# Patient Record
Sex: Female | Born: 1970 | Hispanic: No | Marital: Married | Smoking: Never smoker
Health system: Southern US, Community
[De-identification: ages and names within clinical notes are randomized; demographics above are authoritative.]

## PROBLEM LIST (undated history)

## (undated) DIAGNOSIS — J45909 Unspecified asthma, uncomplicated: Secondary | ICD-10-CM

---

## 2000-04-03 ENCOUNTER — Emergency Department (HOSPITAL_COMMUNITY): Admission: EM | Admit: 2000-04-03 | Discharge: 2000-04-03 | Payer: Self-pay | Admitting: Emergency Medicine

## 2012-01-30 ENCOUNTER — Other Ambulatory Visit: Payer: Self-pay | Admitting: Radiology

## 2012-01-30 DIAGNOSIS — R897 Abnormal histological findings in specimens from other organs, systems and tissues: Secondary | ICD-10-CM

## 2012-02-02 ENCOUNTER — Inpatient Hospital Stay
Admission: RE | Admit: 2012-02-02 | Discharge: 2012-02-02 | Payer: Self-pay | Source: Ambulatory Visit | Attending: Radiology | Admitting: Radiology

## 2012-03-26 ENCOUNTER — Ambulatory Visit
Admission: RE | Admit: 2012-03-26 | Discharge: 2012-03-26 | Disposition: A | Discharge status: Home / Self Care | Payer: Medicaid Other | Source: Ambulatory Visit | Attending: Radiology | Admitting: Radiology

## 2012-03-26 DIAGNOSIS — R897 Abnormal histological findings in specimens from other organs, systems and tissues: Secondary | ICD-10-CM

## 2012-03-26 MED ORDER — GADOBENATE DIMEGLUMINE 529 MG/ML IV SOLN
19.0000 mL | Freq: Once | INTRAVENOUS | Status: AC | PRN
  Administered 2012-03-26: 19 mL via INTRAVENOUS

## 2013-04-10 ENCOUNTER — Encounter (HOSPITAL_COMMUNITY): Payer: Self-pay | Admitting: Physical Medicine and Rehabilitation

## 2013-04-10 ENCOUNTER — Emergency Department (HOSPITAL_COMMUNITY)
Admission: EM | Admit: 2013-04-10 | Discharge: 2013-04-10 | Disposition: A | Discharge status: Home / Self Care | Payer: Medicaid Other | Attending: Emergency Medicine | Admitting: Emergency Medicine

## 2013-04-10 ENCOUNTER — Emergency Department (HOSPITAL_COMMUNITY): Payer: Medicaid Other

## 2013-04-10 DIAGNOSIS — R0602 Shortness of breath: Secondary | ICD-10-CM

## 2013-04-10 DIAGNOSIS — J3489 Other specified disorders of nose and nasal sinuses: Secondary | ICD-10-CM | POA: Insufficient documentation

## 2013-04-10 DIAGNOSIS — M7989 Other specified soft tissue disorders: Secondary | ICD-10-CM | POA: Insufficient documentation

## 2013-04-10 DIAGNOSIS — R079 Chest pain, unspecified: Secondary | ICD-10-CM

## 2013-04-10 DIAGNOSIS — R05 Cough: Secondary | ICD-10-CM

## 2013-04-10 DIAGNOSIS — R0789 Other chest pain: Secondary | ICD-10-CM

## 2013-04-10 DIAGNOSIS — R071 Chest pain on breathing: Secondary | ICD-10-CM | POA: Insufficient documentation

## 2013-04-10 DIAGNOSIS — J45901 Unspecified asthma with (acute) exacerbation: Secondary | ICD-10-CM | POA: Insufficient documentation

## 2013-04-10 HISTORY — DX: Unspecified asthma, uncomplicated: J45.909

## 2013-04-10 LAB — CBC WITH DIFFERENTIAL/PLATELET
Basophils Absolute: 0 10*3/uL (ref 0.0–0.1)
Basophils Relative: 1 % (ref 0–1)
Eosinophils Relative: 2 % (ref 0–5)
Lymphocytes Relative: 25 % (ref 12–46)
MCHC: 33.9 g/dL (ref 30.0–36.0)
MCV: 85.8 fL (ref 78.0–100.0)
Platelets: 316 10*3/uL (ref 150–400)
RDW: 15 % (ref 11.5–15.5)
WBC: 8.7 10*3/uL (ref 4.0–10.5)

## 2013-04-10 LAB — BASIC METABOLIC PANEL
CO2: 22 mEq/L (ref 19–32)
Calcium: 9.2 mg/dL (ref 8.4–10.5)
Creatinine, Ser: 0.58 mg/dL (ref 0.50–1.10)
GFR calc Af Amer: >90 mL/min (ref >90)
GFR calc non Af Amer: >90 mL/min (ref >90)
Sodium: 136 mEq/L (ref 135–145)

## 2013-04-10 LAB — POCT I-STAT TROPONIN I: Troponin i, poc: 0.03 ng/mL (ref 0.00–0.08)

## 2013-04-10 MED ORDER — ALBUTEROL SULFATE HFA 108 (90 BASE) MCG/ACT IN AERS
4.0000 | INHALATION_SPRAY | Freq: Once | RESPIRATORY_TRACT | Status: AC
  Administered 2013-04-10: 4 via RESPIRATORY_TRACT
  Filled 2013-04-10: qty 6.7

## 2013-04-10 NOTE — ED Notes (Signed)
Pt to xray

## 2013-04-10 NOTE — ED Provider Notes (Signed)
PT with sharp, left sided chest pain, +bronchitis symptoms.  No SOB, no pleuritic pain, no calf tenderness.  Mild edema to both legs, L>R.  No pneumonia, no risk factors for VTE.  No symptoms suggestive of ACS.  Nancy Bucco, MD 04/10/13 [phone]

## 2013-04-10 NOTE — ED Provider Notes (Signed)
CSN: [id number]     Arrival date & time 04/10/13  1309 History   None    Chief Complaint  Patient presents with  . Chest Pain   (Consider location/radiation/quality/duration/timing/severity/associated sxs/prior Treatment) Patient is a 42 y.o. female presenting with chest pain. The history is provided by the patient. No language interpreter was used.  Chest Pain Pain location:  L chest Pain quality: aching and sharp   Pain radiates to:  Does not radiate Pain radiates to the back: no   Pain severity:  Mild Onset quality:  Gradual Duration:  8 hours Timing:  Constant Progression:  Worsening Chronicity:  New Context: at rest   Relieved by:  Nothing Worsened by:  Nothing tried Ineffective treatments: inhaler, NSAIDS. Associated symptoms: cough   Associated symptoms: no abdominal pain, no dysphagia, no fever, no headache, no nausea, no orthopnea, no palpitations, no shortness of breath, no syncope and not vomiting     Past Medical History  Diagnosis Date  . Asthma    No past surgical history on file. No family history on file. History  Substance Use Topics  . Smoking status: Never Smoker   . Smokeless tobacco: Not on file  . Alcohol Use: No   OB History   Grav Para Term Preterm Abortions TAB SAB Ect Mult Living                 Review of Systems  Constitutional: Negative for fever.  HENT: Positive for congestion. Negative for sore throat, rhinorrhea and trouble swallowing.   Respiratory: Positive for cough. Negative for shortness of breath.   Cardiovascular: Positive for chest pain and leg swelling. Negative for palpitations, orthopnea and syncope.  Gastrointestinal: Negative for nausea, vomiting, abdominal pain and diarrhea.  Genitourinary: Negative for dysuria and hematuria.  Neurological: Negative for syncope, light-headedness and headaches.  All other systems reviewed and are negative.    Allergies  Review of patient's allergies indicates no known  allergies.  Home Medications  No current outpatient prescriptions on file. BP 131/82  Pulse 87  Temp(Src) 98.2 F (36.8 C) (Oral)  Resp 16  SpO2 96% Physical Exam  Nursing note and vitals reviewed. Constitutional: She is oriented to person, place, and time. She appears well-developed and well-nourished. No distress.  HENT:  Head: Normocephalic and atraumatic.  Eyes: EOM are normal. Pupils are equal, round, and reactive to light.  Neck: Normal range of motion. Neck supple.  Cardiovascular: Normal rate, regular rhythm, normal heart sounds and intact distal pulses.  Exam reveals no gallop and no friction rub.   No murmur heard. Pulmonary/Chest: Effort normal. No respiratory distress. She has no wheezes. She has rales (course rales in the bases bilaterally). She exhibits tenderness.  Abdominal: Soft. Bowel sounds are normal. She exhibits no distension. There is no tenderness. There is no rebound.  Musculoskeletal: Normal range of motion. She exhibits no edema and no tenderness.  Unilateral left leg swelling> right  Lymphadenopathy:    She has no cervical adenopathy.  Neurological: She is alert and oriented to person, place, and time.  Skin: Skin is warm. No rash noted. She is not diaphoretic.  Psychiatric: She has a normal mood and affect. Her behavior is normal.    ED Course  Procedures (including critical care time) Labs Review Labs Reviewed  BASIC METABOLIC PANEL - Abnormal; Notable for the following:    Glucose, Bld 100 (*)    All other components within normal limits  CBC WITH DIFFERENTIAL  D-DIMER, QUANTITATIVE  POCT  I-STAT TROPONIN I   Imaging Review Dg Chest 2 View  04/10/2013   *RADIOLOGY REPORT*  Clinical Data: Chest pain  CHEST - 2 VIEW  Comparison: None.  Findings: Heart and pulmonary vascularity are within normal limits. Lungs are clear bilaterally.  No acute bony abnormality is noted.  IMPRESSION: No acute abnormalities seen.   Original Report Authenticated By:  Alcide Clever, M.D.    MDM   1. Chest pain   2. Shortness of breath   3. Cough   4. Chest wall pain    2:42 PM Pt is a 42 y.o. female with pertinent PMHX of asthma,bronchitis who presents to the ED with chest pain. Chest pain for . Described as sharp substernal/left side of chest/right side of chest/entire chest with/without radiation to left arm/left jaw. No  Associated shortness of breath, diaphoresis, nausea and vomiting. Deniespleuritic chest pain. Denies, endorses recent illness, cough, congestion, URI. Daughter sick with URI 3 weeks ago.  Denies personal history/family history of DVT/PE, denies recent immobilization, denies OCP use. Symptoms worse with nothing, relieved with none. Unilateral leg swelling of the left leg.  Risk Factors: no premature CAD, no smoking, PERC negative  Previous Stress test none. Previous Cath: none.  Primary Cardiologist: none.  On exam: AFVSS. Course rales in bases, that cleared somewhat with cough. Chest tenderness. Leftl eg swelling>right. Plan for CBC, BMP, iStat troponin, CXR and EKG. Will also give 4 puffs albuterol and d-dimer.  EKG personally reviewed by myself showed NSR Rate of 93, PR , QRS 64ms QT/QTC 352/424ms, normal axis, without evidence of new ischemia. No Comparison , indication: chest pain no evidence of acute pericarditis  CXR PA/LAT per my read for chest pain showed no ptx, no pneumonia, no cardiomegaly.  Review of labs showed iStat troponin 0.03, CBC showed no leukocytosis, no evidence of anemia. BMP showed no electrolyte abnormalities. D-dimer negative, low clinical suspicion for PE or DVT. Plan for discharge. On re-evaluation pt feels much better after albuterol. Will have pt continue albuterol as needed. Likely chest wall pain.  5:13 PM:  I have discussed the diagnosis/risks/treatment options with the patient and believe the pt to be eligible for discharge home to follow-up with PCP in 1 week. We also discussed returning to the ED  immediately if new or worsening sx occur. We discussed the sx which are most concerning (e.g., worsening chest pain, shortness of breath, fevers) that necessitate immediate return. Any new prescriptions provided to the patient are listed below.   New Prescriptions   No medications on file    The patient appears reasonably screened and/or stabilized for discharge and I doubt any other medical condition or other [facility] requiring further screening, evaluation or treatment in the ED at this time prior to discharge . Pt in agreement with discharge plan. Return precautions given. Pt discharged VSS  Labs, EKG and imaging reviewed by myself and considered in medical decision making if ordered.  Imaging interpreted by radiology. Pt was discussed with my attending, Dr. Nicole Cella, MD 04/11/13 [phone]

## 2013-04-10 NOTE — ED Notes (Signed)
Pt presents to department for evaluation of L sided chest pain. Onset Saturday night. 7/10 pain at the time. States recent bronchitis. Respirations unlabored. Skin warm and dry. NAD.

## 2013-04-12 NOTE — ED Provider Notes (Signed)
I saw and evaluated the patient, reviewed the resident's note and I agree with the findings and plan. Pt with atypical CP, no ischemic changes on EKG, trop/d-dimer neg.  Will d/c with outpt f/u  Rolan Bucco, MD 04/12/13 1425

## 2013-09-21 ENCOUNTER — Emergency Department (HOSPITAL_COMMUNITY): Payer: Medicaid Other

## 2013-09-21 ENCOUNTER — Emergency Department (HOSPITAL_COMMUNITY)
Admission: EM | Admit: 2013-09-21 | Discharge: 2013-09-21 | Disposition: A | Discharge status: Home / Self Care | Payer: Medicaid Other | Attending: Emergency Medicine | Admitting: Emergency Medicine

## 2013-09-21 ENCOUNTER — Encounter (HOSPITAL_COMMUNITY): Payer: Self-pay | Admitting: Emergency Medicine

## 2013-09-21 DIAGNOSIS — B9789 Other viral agents as the cause of diseases classified elsewhere: Secondary | ICD-10-CM

## 2013-09-21 DIAGNOSIS — J45901 Unspecified asthma with (acute) exacerbation: Secondary | ICD-10-CM | POA: Insufficient documentation

## 2013-09-21 DIAGNOSIS — J069 Acute upper respiratory infection, unspecified: Secondary | ICD-10-CM | POA: Insufficient documentation

## 2013-09-21 MED ORDER — PREDNISONE 20 MG PO TABS: 40.0000 mg | ORAL_TABLET | Freq: Every day | ORAL | Status: AC

## 2013-09-21 MED ORDER — ALBUTEROL SULFATE (2.5 MG/3ML) 0.083% IN NEBU
5.0000 mg | INHALATION_SOLUTION | Freq: Once | RESPIRATORY_TRACT | Status: AC
  Administered 2013-09-21: 5 mg via RESPIRATORY_TRACT
  Filled 2013-09-21: qty 6

## 2013-09-21 NOTE — Discharge Instructions (Signed)
1. Medications: prednisone, usual home medications including her albuterol inhaler 2. Treatment: rest, drink plenty of fluids, Tylenol or ibuprofen for fever and chills 3. Follow Up: Please followup with your primary doctor for discussion of your diagnoses and further evaluation after today's visit;   Upper Respiratory Infection, Adult An upper respiratory infection (URI) is also known as the common cold. It is often caused by a type of germ (virus). Colds are easily spread (contagious). You can pass it to others by kissing, coughing, sneezing, or drinking out of the same glass. Usually, you get better in 1 or 2 weeks.  HOME CARE   Only take medicine as told by your doctor.  Use a warm mist humidifier or breathe in steam from a hot shower.  Drink enough water and fluids to keep your pee (urine) clear or pale yellow.  Get plenty of rest.  Return to work when your temperature is back to normal or as told by your doctor. You may use a face mask and wash your hands to stop your cold from spreading. GET HELP RIGHT AWAY IF:   After the first few days, you feel you are getting worse.  You have questions about your medicine.  You have chills, shortness of breath, or brown or red spit (mucus).  You have yellow or brown snot (nasal discharge) or pain in the face, especially when you bend forward.  You have a fever, puffy (swollen) neck, pain when you swallow, or white spots in the back of your throat.  You have a bad headache, ear pain, sinus pain, or chest pain.  You have a high-pitched whistling sound when you breathe in and out (wheezing).  You have a lasting cough or cough up blood.  You have sore muscles or a stiff neck. MAKE SURE YOU:   Understand these instructions.  Will watch your condition.  Will get help right away if you are not doing well or get worse. Document Released: 01/14/2008 Document Revised: 10/20/2011 Document Reviewed: 12/02/2010 [facility] Patient Information  2014 [place], [place].

## 2013-09-21 NOTE — ED Notes (Signed)
Pt presents to department for evaluation of cough, sore throat and fever. Ongoing x2 days. Pt states history of bronchitis. Respirations unlabored. Pt is alert and oriented x4.

## 2013-09-21 NOTE — ED Provider Notes (Signed)
CSN: [id number]     Arrival date & time 09/21/13  1518 History   This chart was scribed for non-physician practitioner Dierdre Forth, PA-C working with Shelda Jakes, MD by Donne Anon, ED Scribe. This patient was seen in room TR08C/TR08C and the patient's care was started at 1608.   First MD Initiated Contact with Patient 09/21/13 1608     Chief Complaint  Patient presents with  . Cough  . Fever      The history is provided by the patient and medical records. No language interpreter was used.   HPI Comments: Nancy Cooke is a 43 y.o. female with hx of asthma, who presents to the Emergency Department complaining of several weeks of gradual onset, gradually worsening productive cough which worsened over the past 2 days. She states previously prescribed Advair for this cough, and she used it once last night, but it did not provide relief. She reports that she only uses this as needed.  She reports associated sore throat and nasal congestion, and states sometimes blood streaks the mucous when she blows her nose. She denies difficulty breathing, SOB, ear pain, nausea, vomiting or any other symptoms. She did not receive a flu shot this year. She has been exposed to sick individuals.  Dr. French Ana is her PCP.   Past Medical History  Diagnosis Date  . Asthma    History reviewed. No pertinent past surgical history. No family history on file. History  Substance Use Topics  . Smoking status: Never Smoker   . Smokeless tobacco: Not on file  . Alcohol Use: No   OB History   Grav Para Term Preterm Abortions TAB SAB Ect Mult Living                 Review of Systems  Constitutional: Negative for fever, chills, appetite change and fatigue.  HENT: Positive for congestion, postnasal drip, rhinorrhea and sinus pressure. Negative for ear discharge, ear pain, mouth sores and sore throat.   Eyes: Negative for visual disturbance.  Respiratory: Positive for cough, chest tightness and  wheezing. Negative for shortness of breath and stridor.   Cardiovascular: Negative for chest pain, palpitations and leg swelling.  Gastrointestinal: Negative for nausea, vomiting, abdominal pain and diarrhea.  Genitourinary: Negative for dysuria, urgency, frequency and hematuria.  Musculoskeletal: Negative for arthralgias, back pain, myalgias and neck stiffness.  Skin: Negative for rash.  Neurological: Positive for headaches (mild, generalized). Negative for syncope, light-headedness and numbness.  Hematological: Negative for adenopathy.  Psychiatric/Behavioral: The patient is not nervous/anxious.   All other systems reviewed and are negative.    Allergies  Review of patient's allergies indicates no known allergies.  Home Medications   Current Outpatient Rx  Name  Route  Sig  Dispense  Refill  . ibuprofen (ADVIL,MOTRIN) 800 MG tablet   Oral   Take 800 mg by mouth every 8 (eight) hours as needed for pain.         Marland Kitchen OVER THE COUNTER MEDICATION   Oral   Take 1-2 tablets by mouth every 12 (twelve) hours as needed (cough). Over the counter cough and cold med         . predniSONE (DELTASONE) 20 MG tablet   Oral   Take 2 tablets (40 mg total) by mouth daily.   10 tablet   0    BP 130/79  Pulse 92  Temp(Src) 98.6 F (37 C) (Oral)  Resp 18  Wt 198 lb (89.812 kg)  SpO2 94%  LMP 09/07/2013  Physical Exam  Constitutional: She is oriented to person, place, and time. She appears well-developed and well-nourished. No distress.  HENT:  Head: Normocephalic and atraumatic.  Right Ear: Tympanic membrane, external ear and ear canal normal.  Left Ear: Tympanic membrane, external ear and ear canal normal.  Nose: Mucosal edema and rhinorrhea present. No epistaxis. Right sinus exhibits no maxillary sinus tenderness and no frontal sinus tenderness. Left sinus exhibits no maxillary sinus tenderness and no frontal sinus tenderness.  Mouth/Throat: Uvula is midline, oropharynx is clear and  moist and mucous membranes are normal. Mucous membranes are not pale and not cyanotic. No oropharyngeal exudate, posterior oropharyngeal edema, posterior oropharyngeal erythema or tonsillar abscesses.  Eyes: Conjunctivae are normal. Pupils are equal, round, and reactive to light.  Neck: Normal range of motion and full passive range of motion without pain.  Cardiovascular: Normal rate, normal heart sounds and intact distal pulses.   No murmur heard. Pulmonary/Chest: Effort normal. No accessory muscle usage or stridor. Not tachypneic. No respiratory distress. She has decreased breath sounds (thruoghout). She has wheezes (throughout). She has no rhonchi. She has no rales.  Mild expiratory wheezing throughout  Abdominal: Soft. Bowel sounds are normal. She exhibits no distension. There is no tenderness.  Musculoskeletal: Normal range of motion.  Lymphadenopathy:    She has no cervical adenopathy.  Neurological: She is alert and oriented to person, place, and time. She exhibits normal muscle tone. Coordination normal.  Skin: Skin is warm and dry. No rash noted. She is not diaphoretic. No erythema.  Psychiatric: She has a normal mood and affect.    ED Course  Procedures (including critical care time) DIAGNOSTIC STUDIES: Oxygen Saturation is 94% on RA, adequate by my interpretation.    COORDINATION OF CARE: 4:10 PM Discussed treatment plan which includes breathing treatment with pt at bedside and pt agreed to plan. Discussed imaging results. Will discharge home with Albuterol inhaler. Advised pt to follow up with PCP if symptoms worsen or do not improve.   5:27 PM Rechecked pt, who reports improvement from breathing treatment. Upon exam her wheezing has resolved. Will discharge home with Prednisone and inhaler.    Labs Review Labs Reviewed - No data to display Imaging Review Dg Chest 2 View  09/21/2013   CLINICAL DATA:  Cough, fever  EXAM: CHEST  2 VIEW  COMPARISON:  04/10/2013  FINDINGS:  Cardiomediastinal silhouette is stable. No acute infiltrate or pulmonary edema. Bony thorax is unremarkable. Mild basilar atelectasis.  IMPRESSION: No active cardiopulmonary disease.   Electronically Signed   By: Natasha Mead M.D.   On: 09/21/2013 16:02    EKG Interpretation   None       MDM   Final diagnoses:  Viral URI with cough  Asthma exacerbation    Nancy Cooke presents with URI symptoms for 2 days and reports of cough for weeks.  Pt CXR negative for acute infiltrate. I personally reviewed the imaging tests through PACS system.  I reviewed available ER/hospitalization records through the EMR.  Patients symptoms are consistent with URI, likely viral etiology. Will give albuterol inhaler for wheezing and reassess.    5:30 PM Wheezing completely resolved after albuterol treatment.  Discussed that antibiotics are not indicated for viral infections. Pt will be discharged with symptomatic treatment and with 5 day burst of prednisone to treat possible asthma exacerbation.  Verbalizes understanding and is agreeable with plan. Pt is hemodynamically stable & in NAD prior to dc.  It has been determined  that no acute conditions requiring further emergency intervention are present at this time. The patient/guardian have been advised of the diagnosis and plan. We have discussed signs and symptoms that warrant return to the ED, such as changes or worsening in symptoms.   Vital signs are stable at discharge.   BP 130/79  Pulse 92  Temp(Src) 98.6 F (37 C) (Oral)  Resp 18  Wt 198 lb (89.812 kg)  SpO2 94%  LMP 09/07/2013  Patient/guardian has voiced understanding and agreed to follow-up with the PCP or specialist.    I personally performed the services described in this documentation, which was scribed in my presence. The recorded information has been reviewed and is accurate.    Dahlia ClientHannah Farhana Fellows, PA-C 09/21/13 1730

## 2013-09-22 NOTE — ED Provider Notes (Signed)
Medical screening examination/treatment/procedure(s) were performed by non-physician practitioner and as supervising physician I was immediately available for consultation/collaboration.  EKG Interpretation   None         Rosemary Mossbarger W. Jairus Tonne, MD 09/22/13 1639 

## 2015-04-01 IMAGING — CR DG CHEST 2V
2 series · 2 of 2 positions shown · non-contrast
Comparison: 04/10/2013

CLINICAL DATA: Cough, fever

EXAM:
CHEST  2 VIEW

[w chest pa]
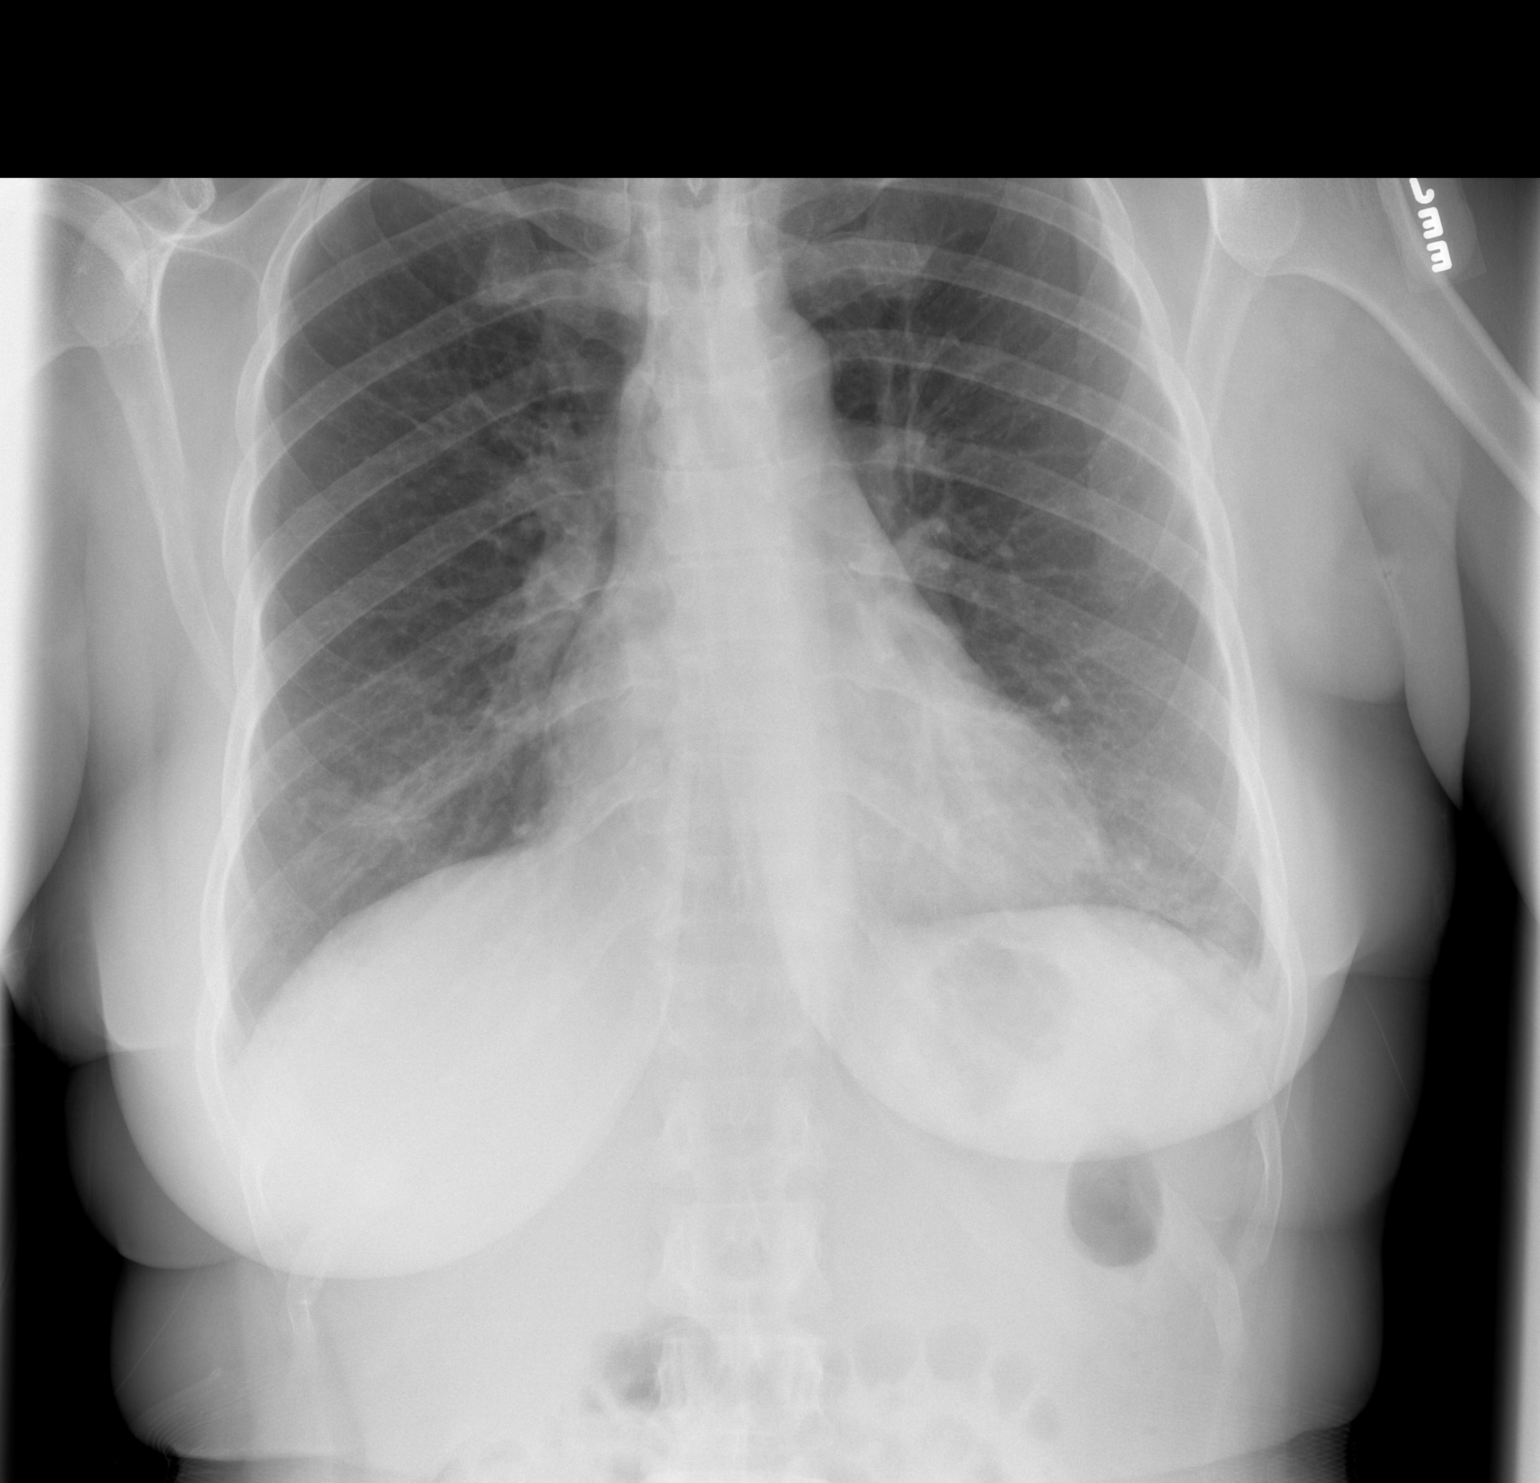

[w chest lat]
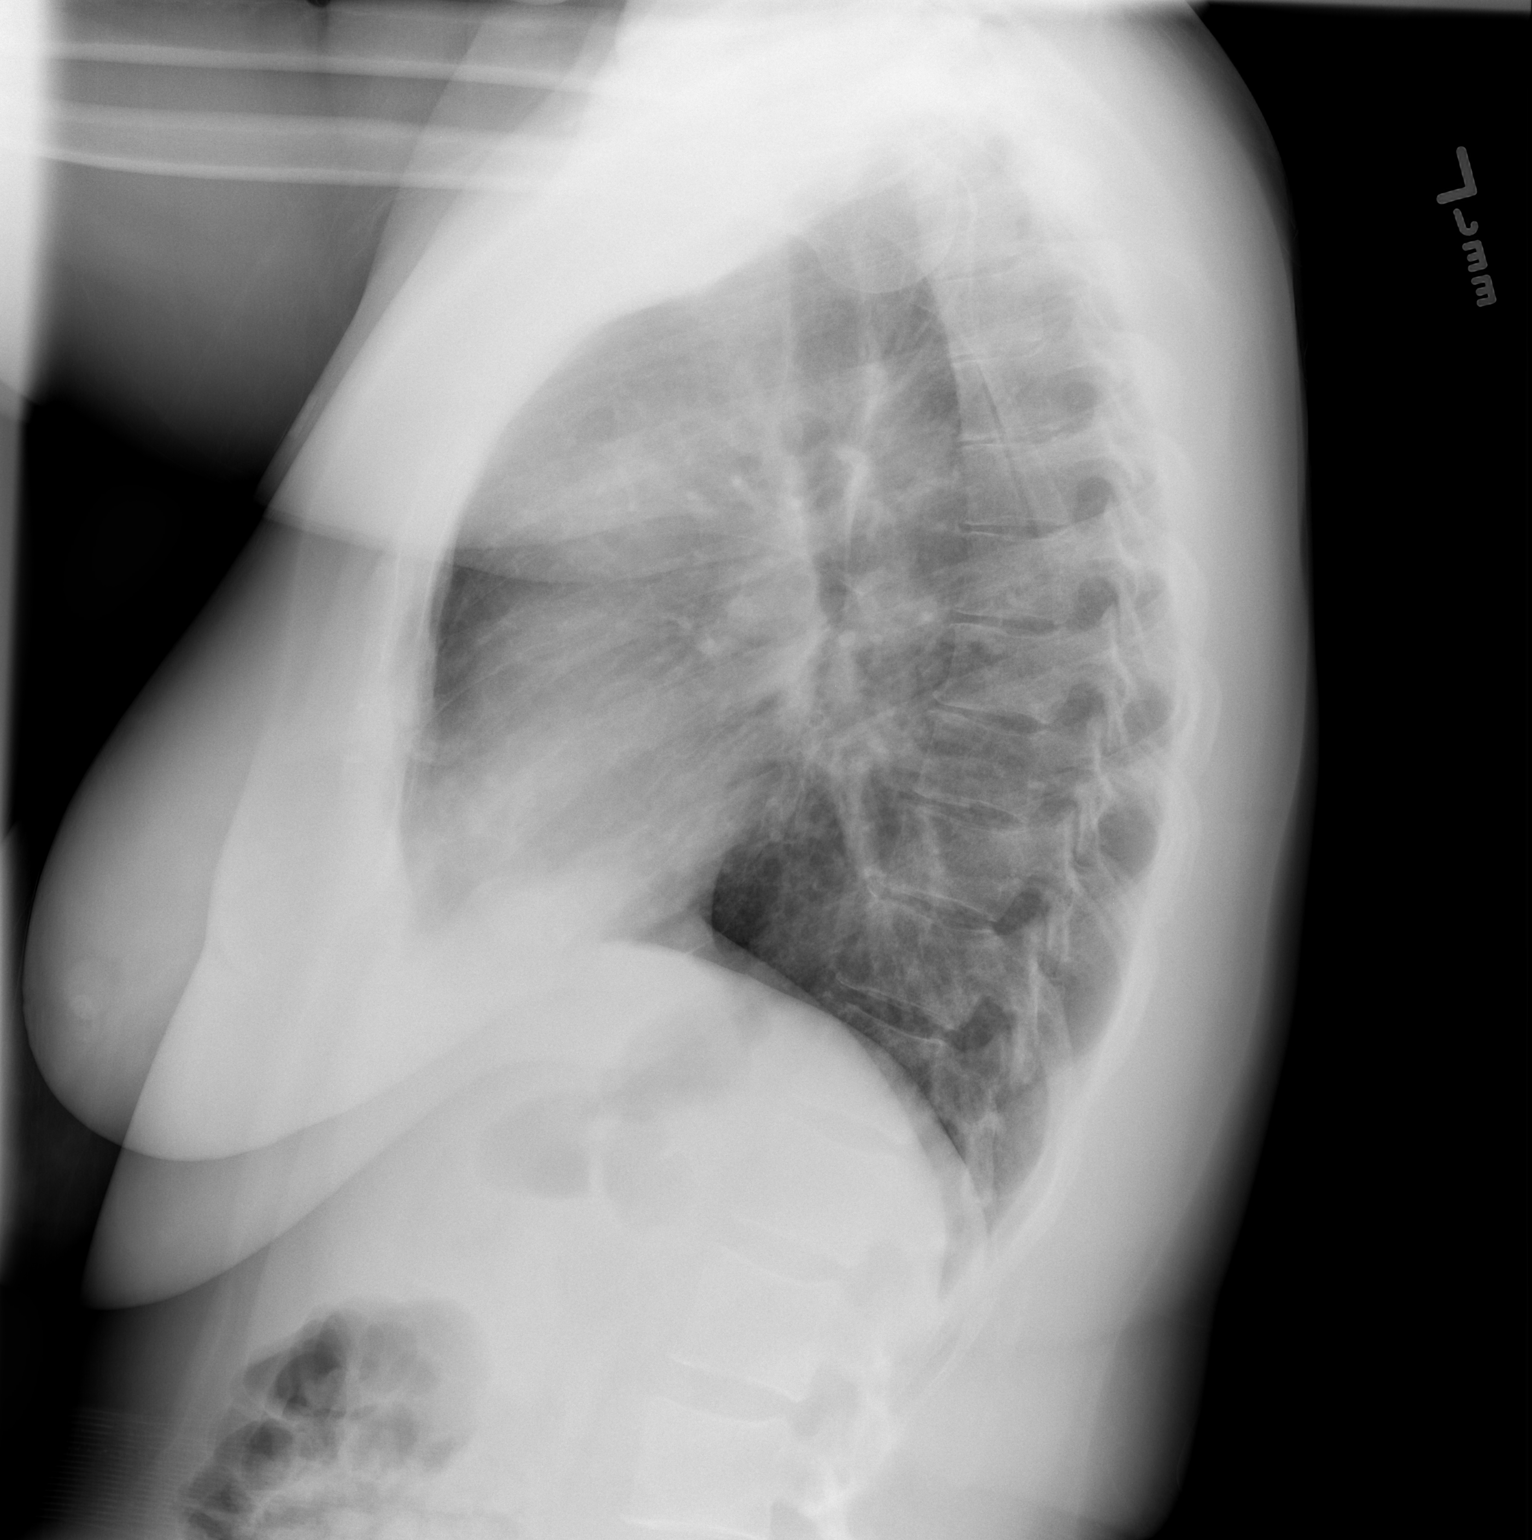

[2 of 2 positions shown; findings below may reference images not displayed]

FINDINGS: Cardiomediastinal silhouette is stable. No acute infiltrate or
pulmonary edema. Bony thorax is unremarkable. Mild basilar
atelectasis.
IMPRESSION: No active cardiopulmonary disease.
